# Patient Record
Sex: Female | Born: 2000 | Race: Black or African American | Hispanic: No | Marital: Single | Smoking: Never smoker
Health system: Southern US, Community
[De-identification: ages and names within clinical notes are randomized; demographics above are authoritative.]

---
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
## 2020-10-05 ENCOUNTER — Ambulatory Visit: Payer: Self-pay

## 2020-10-12 ENCOUNTER — Other Ambulatory Visit: Payer: Self-pay

## 2020-10-12 ENCOUNTER — Encounter: Payer: Self-pay | Admitting: Plastic Surgery

## 2020-10-12 ENCOUNTER — Ambulatory Visit (INDEPENDENT_AMBULATORY_CARE_PROVIDER_SITE_OTHER): Payer: Medicaid Other | Admitting: Plastic Surgery

## 2020-10-12 DIAGNOSIS — G8929 Other chronic pain: Secondary | ICD-10-CM | POA: Diagnosis not present

## 2020-10-12 DIAGNOSIS — M542 Cervicalgia: Secondary | ICD-10-CM

## 2020-10-12 DIAGNOSIS — M546 Pain in thoracic spine: Secondary | ICD-10-CM | POA: Diagnosis not present

## 2020-10-12 DIAGNOSIS — N62 Hypertrophy of breast: Secondary | ICD-10-CM | POA: Diagnosis not present

## 2020-10-12 DIAGNOSIS — M549 Dorsalgia, unspecified: Secondary | ICD-10-CM | POA: Insufficient documentation

## 2020-10-12 NOTE — Progress Notes (Signed)
Patient ID: Mariah Wilson, female    DOB: 10-15-2000, 20 y.o.   MRN: [id number]   Chief Complaint  Patient presents with   Advice Only    Mammary Hyperplasia: The patient is a 20 y.o. female with a history of mammary hyperplasia for several years.  She has extremely large breasts causing symptoms that include the following: Back pain in the upper and lower back, including neck pain. She pulls or pins her bra straps to provide better lift and relief of the pressure and pain. She notices relief by holding her breast up manually.  Her shoulder straps cause grooves and pain and pressure that requires padding for relief. Pain medication is sometimes required with motrin and tylenol.  Activities that are hindered by enlarged breasts include: exercise and running.  She has tried supportive clothing as well as fitted bras without improvement.  Her breasts are extremely large and fairly symmetric but the right 1 is slightly longer.  She has hyperpigmentation of the inframammary area on both sides.  The sternal to nipple distance on the right is 39 cm and the left is 37 cm.  The IMF distance is 17 cm.  She is 5 feet 4 inches tall and weighs 265 pounds.  The BMI = 45.5.  Preoperative bra size = H cup.  She would like to be a DD cup.  The estimated excess breast tissue to be removed at the time of surgery = 900 grams on the left and 900 grams on the right.  Mammogram history: None.  Family history of breast cancer: None.  Tobacco use: None.   She has not done physical therapy but she is willing.  The patient expresses the desire to pursue surgical intervention. She has a history of hypertrophic scars.  One on her nose and 1 on her left wrist.  She had a cyst removed from her left breast that did heal well.   Review of Systems  Constitutional:  Positive for activity change. Negative for appetite change.  Eyes: Negative.   Respiratory: Negative.  Negative for chest tightness and shortness of breath.    Cardiovascular: Negative.  Negative for leg swelling.  Gastrointestinal: Negative.   Endocrine: Negative.   Genitourinary: Negative.   Musculoskeletal:  Positive for back pain and neck pain.  Skin:  Positive for rash. Negative for color change and wound.  Neurological: Negative.   Hematological: Negative.    History reviewed. No pertinent past medical history.  History reviewed. No pertinent surgical history.    Current Outpatient Medications:    fexofenadine (ALLEGRA) 180 MG tablet, Take 180 mg by mouth daily., Disp: , Rfl:    naproxen (NAPROSYN) 500 MG tablet, Take 500 mg by mouth 2 (two) times daily with a meal., Disp: , Rfl:    Objective:   Vitals:   10/12/20 1006  BP: 117/77  Pulse: 61  SpO2: 97%    Physical Exam Vitals and nursing note reviewed.  Constitutional:      Appearance: Normal appearance.  HENT:     Head: Normocephalic and atraumatic.  Cardiovascular:     Rate and Rhythm: Normal rate.     Pulses: Normal pulses.  Pulmonary:     Effort: Pulmonary effort is normal. No respiratory distress.     Breath sounds: No stridor.  Abdominal:     General: Abdomen is flat. There is no distension.     Tenderness: There is no abdominal tenderness.  Skin:    General: Skin is warm.  Capillary Refill: Capillary refill takes less than 2 seconds.     Coloration: Skin is not jaundiced.     Findings: No bruising or lesion.  Neurological:     General: No focal deficit present.     Mental Status: She is alert and oriented to person, place, and time.  Psychiatric:        Mood and Affect: Mood normal.        Behavior: Behavior normal.        Thought Content: Thought content normal.    Assessment & Plan:  Neck pain  Chronic bilateral thoracic back pain  Symptomatic mammary hypertrophy  The procedure the patient selected and that was best for the patient was discussed. The risk were discussed and include but not limited to the following:  Breast asymmetry, fluid  accumulation, firmness of the breast, inability to breast feed, loss of nipple or areola, skin loss, change in skin and nipple sensation, fat necrosis of the breast tissue, bleeding, infection and healing delay.  There are risks of anesthesia and injury to nerves or blood vessels.  Allergic reaction to tape, suture and skin glue are possible.  There will be swelling.  Any of these can lead to the need for revisional surgery.  A breast reduction has potential to interfere with diagnostic procedures in the future.  This procedure is best done when the breast is fully developed.  Changes in the breast will continue to occur over time: pregnancy, weight gain or weigh loss.    Total time: 40 minutes. This includes time spent with the patient during the visit as well as time spent before and after the visit reviewing the chart, documenting the encounter and ordering pertinent studies. and literature emailed to the patient.   Physical therapy: Order placed Mammogram: Not indicated Healthy Weight and Wellness:  Referral made  The patient is a good candidate for bilateral breast reduction.  With her BMI as high as it is she will DC to decrease her weight by 10 to 15 pounds in order to get off the amount that will be required from her insurance.  We went over all this with the patient and she is excited for the possibility and willing to do the above plan as laid out.  She knows to give [place] a call back once she has achieved the goals and finished physical therapy.  Pictures were obtained of the patient and placed in the chart with the patient's or guardian's permission.   Alena Bills [name], DO

## 2020-10-25 ENCOUNTER — Ambulatory Visit: Payer: BLUE CROSS/BLUE SHIELD | Attending: Plastic Surgery

## 2020-10-25 ENCOUNTER — Other Ambulatory Visit: Payer: Self-pay

## 2020-10-25 DIAGNOSIS — M545 Low back pain, unspecified: Secondary | ICD-10-CM | POA: Insufficient documentation

## 2020-10-25 DIAGNOSIS — M546 Pain in thoracic spine: Secondary | ICD-10-CM | POA: Insufficient documentation

## 2020-10-25 DIAGNOSIS — G8929 Other chronic pain: Secondary | ICD-10-CM | POA: Diagnosis present

## 2020-10-25 NOTE — Therapy (Signed)
[facility] Outpatient Rehabilitation [facility] [address] [place], [place], [address] Phone: [phone]   Fax:  [phone]  Physical Therapy Evaluation  Patient Details  Name: Mariah Wilson MRN: [id number] Date of Birth: 2000-04-15 Referring Provider (PT): Peggye Form, DO   Encounter Date: 10/25/2020   PT End of Session - 10/25/20 0904     Visit Number 1    Number of Visits 6    Date for PT Re-Evaluation 12/20/20    Authorization Type MCD - [employer]    PT Start Time 0832    PT Stop Time 0902    PT Time Calculation (min) 30 min    Activity Tolerance Patient tolerated treatment well    Behavior During Therapy [facility] for tasks assessed/performed             No past medical history on file.  No past surgical history on file.  There were no vitals filed for this visit.    Subjective Assessment - 10/25/20 0834     Subjective Pt presents to PT with reports of lower and mid back pain for years due to large breast size. She notes that when she lies supine she feels increased pressure on her chest and back. She has to sleep in sidelying to avoid increasing back pain and get comfortable. Denies b/b changes, saddle anesthesia, or paresthesias in UE/LE. Pt would like to be more active, but feels limited by pain secondary larger breast size.    Pertinent History chronic hx of mid and low back pain secondary to large breast size    Limitations Walking;Standing    How long can you sit comfortably? 30-45 min    How long can you stand comfortably? 1-2 hrs    How long can you walk comfortably? indefinte - (but has pain after)    Patient Stated Goals would like to exercise a little more comfortably    Currently in Pain? 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  No    Is the patient reluctant to leave their home because of a fear of falling?  No      Home Environment   Living Environment Private residence    Type of Home House    Additional Comments no barriers to get in and out of house      Prior Function   Level of Independence  Independent;Independent with basic ADLs      Cognition   Overall Cognitive Status Within Functional Limits for tasks assessed    Attention Focused      Observation/Other Assessments   Focus on Therapeutic Outcomes (FOTO)  no FOTO - MCD and Mammary Hyp      Sensation   Light Touch Appears Intact      Posture/Postural Control   Posture Comments increased lordosis, increased kyphosis, rounded shoulders      Palpation   Palpation comment TTP to thoracic and lumbar paraspinals      Transfers   Comments 30 Sec STS: 9 reps                        Objective measurements completed on examination: See above findings.                PT Education - 10/25/20 0901     Education Details eval findings, oswestry review, HEP, POC    Person(s) Educated Patient    Methods Explanation;Demonstration;Handout    Comprehension Verbalized understanding;Returned demonstration              PT Short Term Goals - 10/25/20 0904       PT SHORT TERM GOAL #1   Title  Pt will be knowledgeable and compliant with initial HEP for improved carryover    Baseline initial HEP given    Time 3    Period Weeks    Status New    Target Date 11/15/20               PT Long Term Goals - 10/25/20 0905       PT LONG TERM GOAL #1   Title Pt will decreae ODI score to no greater than 35% as proxy for functional improvement    Baseline 48% disability    Time 6    Period Weeks    Status New    Target Date 12/06/20      PT LONG TERM GOAL #2   Title Pt will decrease reports of mid and low back pain to no greater than 4/10 at worst for improved comfort and function    Baseline 9/10 at worst    Time 6    Period Weeks    Status New    Target Date 12/06/20      PT LONG TERM GOAL #3   Title Pt will increase reps in 30 sec STS to no less than 12 for improve functional mobility and hip strength    Baseline 9 reps    Time 6    Period Weeks    Status New    Target Date 12/06/20                    Plan - 10/25/20 0911     Clinical Impression Statement Pt is a pleasant 20 y/o F who presents to PT with chronic reports of mid and low back pain secondary to large breast size. Physical findings are consistent with subjective compliants and MD impression, as pt demos postural deficits, functional mobility weaknesses, and palpable LBP. Her Oswestry score indicates moderate-to-severe disability in the performance of home ADLs and activities. She would benefit from skilled PT services working on improving strength and posture in order to decrease pain.    Personal Factors and Comorbidities Fitness    Examination-Activity Limitations Squat;Stairs;Stand;Lift;Carry    Examination-Participation Restrictions Yard Work;Occupation;Community Activity    Stability/Clinical Decision Making Stable/Uncomplicated  Clinical Decision Making Low    Rehab Potential Good    PT Frequency 1x / week    PT Duration 6 weeks    PT Treatment/Interventions ADLs/Self Care Home  Management;Electrical Stimulation;Moist Heat;Cryotherapy;Functional mobility training;Therapeutic activities;Therapeutic exercise;Neuromuscular re-education;Patient/family education;Manual techniques;Joint Manipulations;Spinal Manipulations    PT Next Visit Plan assess HEP response, progress as able    PT Home Exercise Plan Access Code: [email or web address]    Consulted and Agree with Plan of Care Patient             Patient will benefit from skilled therapeutic intervention in order to improve the following deficits and impairments:  Decreased activity tolerance, Pain, Postural dysfunction, Decreased strength  Visit Diagnosis: Chronic midline low back pain without sciatica - Plan: PT plan of care cert/re-cert  Pain in thoracic spine - Plan: PT plan of care cert/re-cert     Problem List Patient Active Problem List   Diagnosis Date Noted   Neck pain 10/12/2020   Back pain 10/12/2020   Symptomatic mammary hypertrophy 10/12/2020    Eloy End, PT 10/25/2020, 9:35 AM  [facility] [address] [place], [place], [address] Phone: [phone]   Fax:  [phone]  Name: Mariah Wilson MRN: [id number] Date of Birth: Oct 15, 2000  Check all possible CPT codes: 97110- Therapeutic Exercise, [phone]- Neuro Re-education, [phone] - Gait Training, [phone] - Manual Therapy, 97530 - Therapeutic Activities, and 97535 - Self Care

## 2020-11-03 ENCOUNTER — Ambulatory Visit: Payer: BLUE CROSS/BLUE SHIELD

## 2020-11-10 ENCOUNTER — Encounter: Payer: Self-pay | Admitting: Physical Therapy

## 2020-11-10 ENCOUNTER — Other Ambulatory Visit: Payer: Self-pay

## 2020-11-10 ENCOUNTER — Ambulatory Visit: Payer: BLUE CROSS/BLUE SHIELD | Attending: Plastic Surgery | Admitting: Physical Therapy

## 2020-11-10 DIAGNOSIS — M546 Pain in thoracic spine: Secondary | ICD-10-CM | POA: Diagnosis present

## 2020-11-10 DIAGNOSIS — M545 Low back pain, unspecified: Secondary | ICD-10-CM | POA: Diagnosis not present

## 2020-11-10 DIAGNOSIS — G8929 Other chronic pain: Secondary | ICD-10-CM | POA: Insufficient documentation

## 2020-11-10 NOTE — Therapy (Signed)
[facility] Outpatient Rehabilitation [facility] [address] [place], [place], [address] Phone: [phone]   Fax:  [phone]  Physical Therapy Treatment  Patient Details  Name: Mariah Wilson MRN: [id number] Date of Birth: 21-Sep-2000 Referring Provider (PT): Peggye Form, DO   Encounter Date: 11/10/2020   PT End of Session - 11/10/20 1022     Visit Number 2    Number of Visits 6    Date for PT Re-Evaluation 12/20/20    Authorization Type MCD - [employer]    Authorization - Number of Visits 12    PT Start Time 1017    PT Stop Time 1055    PT Time Calculation (min) 38 min             History reviewed. No pertinent past medical history.  History reviewed. No pertinent surgical history.  There were no vitals filed for this visit.   Subjective Assessment - 11/10/20 1020     Subjective Pt reports 7/10 low back pain on arrival, no upper back and neck pain.    Currently in Pain? Yes                               [facility] Adult PT Treatment/Exercise - 11/10/20 0001       Neck Exercises: Machines for Strengthening   UBE (Upper Arm Bike) L1 x 3 minutes reverse with cues for posture      Lumbar Exercises: Stretches   Single Knee to Chest Stretch 3 reps;30 seconds    Quadruped Mid Back Stretch Limitations childs pose    Other Lumbar Stretch Exercise door way pec stretch x 3    Other Lumbar Stretch Exercise open books      Lumbar Exercises: Standing   Row 20 reps    Theraband Level (Row) Level 4 (Blue)      Lumbar Exercises: Supine   Bridge 10 reps;5 seconds    Other Supine Lumbar Exercises Horizontal abduction x 20  Green band , ER green band      Lumbar Exercises: Quadruped   Madcat/Old Horse 10 reps    Madcat/Old Horse Limitations cues for technique    Single Arm Raise 10 reps    Single Arm Raises Limitations cues for abdominal draw in and breathing    Straight Leg Raise 10 reps   right wrist discomfort   Straight  Leg Raises Limitations cues for abdominal draw in and breathing    Other Quadruped Lumbar Exercises Abdominal draw ins with breathing    Other Quadruped Lumbar Exercises quadruped rocking into childs pose                       PT Short Term Goals - 11/10/20 1030       PT SHORT TERM GOAL #1   Title Pt will be knowledgeable and compliant with initial HEP for improved carryover    Baseline initial HEP given; 11/10/20 -min compliance with HEP    Time 3    Period Weeks    Status On-going    Target Date 11/15/20               PT Long Term Goals - 10/25/20 0905       PT LONG TERM GOAL #1   Title Pt will decreae ODI score to no greater than 35% as proxy for functional improvement    Baseline 48% disability    Time 6  Period Weeks    Status New    Target Date 12/06/20      PT LONG TERM GOAL #2   Title Pt will decrease reports of mid and low back pain to no greater than 4/10 at worst for improved comfort and function    Baseline 9/10 at worst    Time 6    Period Weeks    Status New    Target Date 12/06/20      PT LONG TERM GOAL #3   Title Pt will increase reps in 30 sec STS to no less than 12 for improve functional mobility and hip strength    Baseline 9 reps    Time 6    Period Weeks    Status New    Target Date 12/06/20                   Plan - 11/10/20 1022     Clinical Impression Statement Pt reports min compliance with HEP and 7/10 low back pain on arrival. Reviewed HEP and progressed with postural and core strength and mobility exercises. Pt tolerated the session well without c/o increased pain. She was given an updated HEP. She reported decreased back pain at end of session.    PT Treatment/Interventions ADLs/Self Care Home Management;Electrical Stimulation;Moist Heat;Cryotherapy;Functional mobility training;Therapeutic activities;Therapeutic exercise;Neuromuscular re-education;Patient/family education;Manual techniques;Joint  Manipulations;Spinal Manipulations    PT Next Visit Plan assess HEP response, progress as able, bird dogs, thoracic extension    PT Home Exercise Plan Access Code: [email or web address]    Consulted and Agree with Plan of Care Patient             Patient will benefit from skilled therapeutic intervention in order to improve the following deficits and impairments:  Decreased activity tolerance, Pain, Postural dysfunction, Decreased strength  Visit Diagnosis: Chronic midline low back pain without sciatica  Pain in thoracic spine     Problem List Patient Active Problem List   Diagnosis Date Noted   Neck pain 10/12/2020   Back pain 10/12/2020   Symptomatic mammary hypertrophy 10/12/2020    Sherrie Mustache, PTA 11/10/2020, 12:15 PM  [facility] [address] [place], [place], [address] Phone: [phone]   Fax:  [phone]  Name: Mariah Wilson MRN: [id number] Date of Birth: 04/30/2000

## 2020-11-17 ENCOUNTER — Encounter: Payer: Self-pay | Admitting: Physical Therapy

## 2020-11-17 ENCOUNTER — Other Ambulatory Visit: Payer: Self-pay

## 2020-11-17 ENCOUNTER — Ambulatory Visit: Payer: BLUE CROSS/BLUE SHIELD | Admitting: Physical Therapy

## 2020-11-17 DIAGNOSIS — M545 Low back pain, unspecified: Secondary | ICD-10-CM

## 2020-11-17 DIAGNOSIS — M546 Pain in thoracic spine: Secondary | ICD-10-CM

## 2020-11-17 DIAGNOSIS — G8929 Other chronic pain: Secondary | ICD-10-CM

## 2020-11-17 NOTE — Therapy (Signed)
[facility] Outpatient Rehabilitation [facility] [address] [place], [place], [address] Phone: [phone]   Fax:  [phone]  Physical Therapy Treatment  Patient Details  Name: Mariah Wilson MRN: [id number] Date of Birth: 04/12/2000 Referring Provider (PT): Peggye Form, DO   Encounter Date: 11/17/2020   PT End of Session - 11/17/20 1226     Visit Number 3    Number of Visits 6    Date for PT Re-Evaluation 12/20/20    Authorization Type MCD - [employer]    Authorization - Number of Visits 12    PT Start Time 0930    PT Stop Time 1010    PT Time Calculation (min) 40 min             History reviewed. No pertinent past medical history.  History reviewed. No pertinent surgical history.  There were no vitals filed for this visit.   Subjective Assessment - 11/17/20 0934     Subjective I have lower back pain with standing for work. Today, I have pain in my mid back. 6/10.    Pertinent History chronic hx of mid and low back pain secondary to large breast size    Currently in Pain? Yes    Pain Score 6     Pain Location Back    Pain Orientation Mid    Pain Descriptors / Indicators Sore;Aching    Pain Type Chronic pain    Aggravating Factors  standing    Pain Relieving Factors lying on side                   [facility] Adult PT Treatment/Exercise - 11/17/20 0001       Transfers   Five time sit to stand comments  14.3 sec      Neck Exercises: Machines for Strengthening   Nustep L5 UE/LE x 5 minutes    Cybex Row 25# low and mid x 15 each    Lat Pull 20# 15 x 2      Lumbar Exercises: Stretches   Single Knee to Chest Stretch 3 reps;30 seconds    Other Lumbar Stretch Exercise door way pec stretch x 3    Other Lumbar Stretch Exercise open books      Lumbar Exercises: Supine   Bridge 10 reps;5 seconds    Other Supine Lumbar Exercises Horizontal abduction x 20  Green band , ER green band    Other Supine Lumbar Exercises 90/90  lifting LE one at a time. x 10      Lumbar Exercises: Quadruped   Madcat/Old Horse 10 reps    Madcat/Old Horse Limitations cues for technique    Straight Leg Raise 10 reps   right wrist discomfort   Straight Leg Raises Limitations cues for abdominal draw in and breathing    Opposite Arm/Leg Raise 10 reps    Opposite Arm/Leg Raise Limitations 5 sec    Other Quadruped Lumbar Exercises quadruped rocking into childs pose                       PT Short Term Goals - 11/10/20 1030       PT SHORT TERM GOAL #1   Title Pt will be knowledgeable and compliant with initial HEP for improved carryover    Baseline initial HEP given; 11/10/20 -min compliance with HEP    Time 3    Period Weeks    Status On-going    Target Date 11/15/20  PT Long Term Goals - 10/25/20 0905       PT LONG TERM GOAL #1   Title Pt will decreae ODI score to no greater than 35% as proxy for functional improvement    Baseline 48% disability    Time 6    Period Weeks    Status New    Target Date 12/06/20      PT LONG TERM GOAL #2   Title Pt will decrease reports of mid and low back pain to no greater than 4/10 at worst for improved comfort and function    Baseline 9/10 at worst    Time 6    Period Weeks    Status New    Target Date 12/06/20      PT LONG TERM GOAL #3   Title Pt will increase reps in 30 sec STS to no less than 12 for improve functional mobility and hip strength    Baseline 9 reps    Time 6    Period Weeks    Status New    Target Date 12/06/20                   Plan - 11/17/20 0936     Clinical Impression Statement Pt reports increased compliance with HEP , performing 3 times over the last week. She reports the exercises helpt to decrease the tension in her back. Continued with postural strength and nobility per PT POC. Pt tolerated all therex well without increased pain.    PT Treatment/Interventions ADLs/Self Care Home Management;Electrical  Stimulation;Moist Heat;Cryotherapy;Functional mobility training;Therapeutic activities;Therapeutic exercise;Neuromuscular re-education;Patient/family education;Manual techniques;Joint Manipulations;Spinal Manipulations    PT Next Visit Plan assess HEP response, progress as able, bird dogs, thoracic extension    PT Home Exercise Plan Access Code: [email or web address]             Patient will benefit from skilled therapeutic intervention in order to improve the following deficits and impairments:  Decreased activity tolerance, Pain, Postural dysfunction, Decreased strength  Visit Diagnosis: Chronic midline low back pain without sciatica  Pain in thoracic spine     Problem List Patient Active Problem List   Diagnosis Date Noted   Neck pain 10/12/2020   Back pain 10/12/2020   Symptomatic mammary hypertrophy 10/12/2020    Sherrie Mustache, PTA 11/17/2020, 12:28 PM  [facility] [address] [place], [place], [address] Phone: [phone]   Fax:  [phone]  Name: Mariah Wilson MRN: [id number] Date of Birth: October 10, 2000

## 2020-11-24 ENCOUNTER — Encounter: Payer: Self-pay | Admitting: Physical Therapy

## 2020-11-24 ENCOUNTER — Ambulatory Visit: Payer: BLUE CROSS/BLUE SHIELD | Admitting: Physical Therapy

## 2020-11-24 ENCOUNTER — Other Ambulatory Visit: Payer: Self-pay

## 2020-11-24 DIAGNOSIS — M546 Pain in thoracic spine: Secondary | ICD-10-CM

## 2020-11-24 DIAGNOSIS — M545 Low back pain, unspecified: Secondary | ICD-10-CM

## 2020-11-24 DIAGNOSIS — G8929 Other chronic pain: Secondary | ICD-10-CM

## 2020-11-24 NOTE — Therapy (Signed)
[facility] Outpatient Rehabilitation [facility] [address] [place], [place], [address] Phone: [phone]   Fax:  [phone]  Physical Therapy Treatment  Patient Details  Name: Mariah Wilson MRN: [id number] Date of Birth: 22-Jan-2001 Referring Provider (PT): Peggye Form, DO   Encounter Date: 11/24/2020   PT End of Session - 11/24/20 0949     Visit Number 4    Number of Visits 6    Date for PT Re-Evaluation 12/20/20    Authorization Type MCD - [employer]    Authorization - Number of Visits 12    PT Start Time 0945   15 minutes late   PT Stop Time 1013    PT Time Calculation (min) 28 min             History reviewed. No pertinent past medical history.  History reviewed. No pertinent surgical history.  There were no vitals filed for this visit.   Subjective Assessment - 11/24/20 0947     Subjective My back has felt better. I have been more accountable with my exercises.    Currently in Pain? Yes    Pain Score 5     Pain Location Back    Pain Orientation Mid    Pain Descriptors / Indicators Discomfort    Pain Type Chronic pain    Aggravating Factors  standing    Pain Relieving Factors lying on side                        [facility] Adult PT Treatment/Exercise - 11/24/20 0001       Neck Exercises: Machines for Strengthening   UBE (Upper Arm Bike) L2 3 min each way    Cybex Row 35# low and mid row x 15 each    Lat Pull 25# 15 x 2      Lumbar Exercises: Stretches   Other Lumbar Stretch Exercise door way pec stretch x 3      Lumbar Exercises: Standing   Other Standing Lumbar Exercises Green band star pattern x 15      Lumbar Exercises: Supine   Other Supine Lumbar Exercises 90/90 lifting LE one at a time. x 10      Lumbar Exercises: Quadruped   Opposite Arm/Leg Raise 10 reps    Opposite Arm/Leg Raise Limitations 5 sec                       PT Short Term Goals - 11/10/20 1030       PT SHORT TERM  GOAL #1   Title Pt will be knowledgeable and compliant with initial HEP for improved carryover    Baseline initial HEP given; 11/10/20 -min compliance with HEP    Time 3    Period Weeks    Status On-going    Target Date 11/15/20               PT Long Term Goals - 10/25/20 0905       PT LONG TERM GOAL #1   Title Pt will decreae ODI score to no greater than 35% as proxy for functional improvement    Baseline 48% disability    Time 6    Period Weeks    Status New    Target Date 12/06/20      PT LONG TERM GOAL #2   Title Pt will decrease reports of mid and low back pain to no greater than 4/10 at worst for improved  comfort and function    Baseline 9/10 at worst    Time 6    Period Weeks    Status New    Target Date 12/06/20      PT LONG TERM GOAL #3   Title Pt will increase reps in 30 sec STS to no less than 12 for improve functional mobility and hip strength    Baseline 9 reps    Time 6    Period Weeks    Status New    Target Date 12/06/20                   Plan - 11/24/20 1013     Clinical Impression Statement Pt arrived late today. Shorter session focused on posterior chain and core strengthening. Pt verbalized overall improvement in pain.    PT Treatment/Interventions ADLs/Self Care Home Management;Electrical Stimulation;Moist Heat;Cryotherapy;Functional mobility training;Therapeutic activities;Therapeutic exercise;Neuromuscular re-education;Patient/family education;Manual techniques;Joint Manipulations;Spinal Manipulations    PT Next Visit Plan assess HEP response, progress as able, bird dogs, thoracic extension    PT Home Exercise Plan Access Code: [email or web address]             Patient will benefit from skilled therapeutic intervention in order to improve the following deficits and impairments:  Decreased activity tolerance, Pain, Postural dysfunction, Decreased strength  Visit Diagnosis: Chronic midline low back pain without sciatica  Pain in thoracic  spine     Problem List Patient Active Problem List   Diagnosis Date Noted   Neck pain 10/12/2020   Back pain 10/12/2020   Symptomatic mammary hypertrophy 10/12/2020    Sherrie Mustache, PTA 11/24/2020, 10:14 AM  [facility] [address] [place], [place], [address] Phone: [phone]   Fax:  [phone]  Name: Mariah Wilson MRN: [id number] Date of Birth: 2000-04-30

## 2020-12-01 ENCOUNTER — Ambulatory Visit: Payer: BLUE CROSS/BLUE SHIELD

## 2020-12-01 ENCOUNTER — Other Ambulatory Visit: Payer: Self-pay

## 2020-12-01 DIAGNOSIS — M545 Low back pain, unspecified: Secondary | ICD-10-CM | POA: Diagnosis not present

## 2020-12-01 DIAGNOSIS — M546 Pain in thoracic spine: Secondary | ICD-10-CM

## 2020-12-01 DIAGNOSIS — G8929 Other chronic pain: Secondary | ICD-10-CM

## 2020-12-01 NOTE — Therapy (Signed)
[facility] [address] [place], [place], [address] Phone: [phone]   Fax:  [phone]  Physical Therapy Treatment  Patient Details  Name: Mariah Wilson MRN: [id number] Date of Birth: 08/20/2000 Referring Provider (PT): Peggye Form, DO   Encounter Date: 12/01/2020   PT End of Session - 12/01/20 1002     Visit Number 5    Number of Visits 6    Date for PT Re-Evaluation 12/20/20    Authorization Type MCD - [employer]    Authorization - Number of Visits 12    PT Start Time 1002    PT Stop Time 1040    PT Time Calculation (min) 38 min             No past medical history on file.  No past surgical history on file.  There were no vitals filed for this visit.   Subjective Assessment - 12/01/20 1003     Subjective Pt presents to PT with reports of continued mid back pain and discomfort. Has been compliant with her HEP with no adverse effect. Ready to begin PT at this time.    Currently in Pain? Yes    Pain Score 4     Pain Location Back    Pain Orientation Mid           [facility] Adult PT Treatment/Exercise:   Therapeutic Exercise:  UBE lvl 2.0 x 4 min while taking subjective Total gym row 2x10 35lbs Lat pulldown 2x10 35lbs Row 3x12 23lbs Shoulder ext 2x10 23lbs Supine horizontal abd 3x10 green tband S/L open book 2x10 ea Bird dog 2x10 Seated thoracic ext over soft foam x 15 Serratus wall slide soft foam yellow tband 2x10                               PT Short Term Goals - 11/10/20 1030       PT SHORT TERM GOAL #1   Title Pt will be knowledgeable and compliant with initial HEP for improved carryover    Baseline initial HEP given; 11/10/20 -min compliance with HEP    Time 3    Period Weeks    Status On-going    Target Date 11/15/20               PT Long Term Goals - 10/25/20 0905       PT LONG TERM GOAL #1   Title Pt will decreae ODI score to no  greater than 35% as proxy for functional improvement    Baseline 48% disability    Time 6    Period Weeks    Status New    Target Date 12/06/20      PT LONG TERM GOAL #2   Title Pt will decrease reports of mid and low back pain to no greater than 4/10 at worst for improved comfort and function    Baseline 9/10 at worst    Time 6    Period Weeks    Status New    Target Date 12/06/20      PT LONG TERM GOAL #3   Title Pt will increase reps in 30 sec STS to no less than 12 for improve functional mobility and hip strength    Baseline 9 reps    Time 6    Period Weeks    Status New    Target Date 12/06/20  Plan - 12/01/20 1006     Clinical Impression Statement Pt was able to complete all prescribed exercises with no adverse effect. Today's session focused on continued periscapular and core muscle strengthening to reduce pain. PT will assess goals at next session for possible d/c per POC.    PT Treatment/Interventions ADLs/Self Care Home Management;Electrical Stimulation;Moist Heat;Cryotherapy;Functional mobility training;Therapeutic activities;Therapeutic exercise;Neuromuscular re-education;Patient/family education;Manual techniques;Joint Manipulations;Spinal Manipulations    PT Next Visit Plan assess goals and prepare discharge HEP    PT Home Exercise Plan Access Code: [email or web address]             Patient will benefit from skilled therapeutic intervention in order to improve the following deficits and impairments:  Decreased activity tolerance, Pain, Postural dysfunction, Decreased strength  Visit Diagnosis: Chronic midline low back pain without sciatica  Pain in thoracic spine     Problem List Patient Active Problem List   Diagnosis Date Noted   Neck pain 10/12/2020   Back pain 10/12/2020   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 [address] [place], [place], [address] Phone: [phone]   Fax:  [phone]  Name: Mariah Wilson MRN: [id number] Date of Birth: May 08, 2000

## 2020-12-08 ENCOUNTER — Other Ambulatory Visit: Payer: Self-pay

## 2020-12-08 ENCOUNTER — Ambulatory Visit: Payer: BLUE CROSS/BLUE SHIELD | Attending: Plastic Surgery | Admitting: Physical Therapy

## 2020-12-08 DIAGNOSIS — M545 Low back pain, unspecified: Secondary | ICD-10-CM | POA: Diagnosis present

## 2020-12-08 DIAGNOSIS — M546 Pain in thoracic spine: Secondary | ICD-10-CM | POA: Insufficient documentation

## 2020-12-08 DIAGNOSIS — G8929 Other chronic pain: Secondary | ICD-10-CM | POA: Insufficient documentation

## 2020-12-08 NOTE — Therapy (Addendum)
[facility] [place], [place], [address] Phone: [phone]   Fax:  [phone]  Physical Therapy Treatment/Discharge  Patient Details  Name: Mariah Wilson MRN: [id number] Date of Birth: 2000-11-02 Referring Provider (PT): Wallace Going, DO   Encounter Date: 12/08/2020   PT End of Session - 12/08/20 1023     Visit Number 6    Number of Visits 6    Date for PT Re-Evaluation 12/20/20    Authorization Type MCD - [employer]    Authorization - Number of Visits 12    PT Start Time 9458    PT Stop Time 1045    PT Time Calculation (min) 30 min             No past medical history on file.  No past surgical history on file.  There were no vitals filed for this visit.   Subjective Assessment - 12/08/20 1021     Subjective 4/10 now. The worst can be 7.5 on a bad day of long work hours or sitting in a chair all class.    Currently in Pain? Yes    Pain Score 4     Pain Location Back    Pain Orientation Mid;Lower;Upper    Pain Descriptors / Indicators Aching    Pain Type Chronic pain    Aggravating Factors  standing, prolonged sitting, working    Pain Relieving Factors lying on side , stretches                [facility] PT Assessment - 12/08/20 0001       Observation/Other Assessments   Other Surveys  Oswestry Disability Index    Oswestry Disability Index  11- Mild disability      Transfers   Comments 30 Sec STS: 14 reps                  [facility] Adult PT Treatment/Exercise - 12/08/20 0001       Neck Exercises: Machines for Strengthening   UBE (Upper Arm Bike) L2 3 min each way      Lumbar Exercises: Stretches   Quadruped Mid Back Stretch Limitations childs pose    Other Lumbar Stretch Exercise door way pec stretch x 3    Other Lumbar Stretch Exercise open books      Lumbar Exercises: Standing   Row 20 reps    Theraband Level (Row) Level 4 (Blue)    Shoulder Extension 20 reps     Theraband Level (Shoulder Extension) Level 4 (Blue)    Other Standing Lumbar Exercises Green band star pattern x 15      Lumbar Exercises: Supine   Other Supine Lumbar Exercises 90/90 lifting LE one at a time. x 10      Lumbar Exercises: Quadruped   Opposite Arm/Leg Raise 10 reps    Opposite Arm/Leg Raise Limitations 5 sec                       PT Short Term Goals - 12/08/20 1024       PT SHORT TERM GOAL #1   Title Pt will be knowledgeable and compliant with initial HEP for improved carryover    Baseline initial HEP given; 11/10/20 -min compliance with HEP; 12/08/20    Time 3    Period Weeks    Status Achieved    Target Date 11/15/20               PT  Long Term Goals - 12/08/20 1025       PT LONG TERM GOAL #1   Title Pt will decreae ODI score to no greater than 35% as proxy for functional improvement    Baseline 48% disability; 12/08/20: 11 points (mild disability)    Time 6    Period Weeks    Status Achieved      PT LONG TERM GOAL #2   Title Pt will decrease reports of mid and low back pain to no greater than 4/10 at worst for improved comfort and function    Baseline 9/10 at worst;12/08/20  7.5 at worst    Time 6    Period Weeks    Status Not Met      PT LONG TERM GOAL #3   Title Pt will increase reps in 30 sec STS to no less than 12 for improve functional mobility and hip strength    Baseline 9 reps: 12/08/20: 14 reps    Time 6    Period Weeks    Status Achieved                   Plan - 12/08/20 1043     Clinical Impression Statement 30 sec STS improved to 14 reps and ODI survey improved to mild disability LTG# 1,3 met. Her pain level has decreased from her highest reaching 9.5/10 to now reaching 7.5/10 at most. She is independent with her HEP and agreeable to discharge today. She has plans to make a F/U appointment to discuss potential surgical intervention.    PT Treatment/Interventions ADLs/Self Care Home Management;Electrical  Stimulation;Moist Heat;Cryotherapy;Functional mobility training;Therapeutic activities;Therapeutic exercise;Neuromuscular re-education;Patient/family education;Manual techniques;Joint Manipulations;Spinal Manipulations    PT Next Visit Plan discharge to HEP today.    PT Home Exercise Plan Access Code: [email or web address]             Patient will benefit from skilled therapeutic intervention in order to improve the following deficits and impairments:  Decreased activity tolerance, Pain, Postural dysfunction, Decreased strength  Visit Diagnosis: Chronic midline low back pain without sciatica  Pain in thoracic spine     Problem List Patient Active Problem List   Diagnosis Date Noted   Neck pain 10/12/2020   Back pain 10/12/2020   Symptomatic mammary hypertrophy 10/12/2020    Dorene Ar, PTA 12/08/2020, 10:51 AM  [facility] [address] [place], [place], [address] Phone: [phone]   Fax:  [phone]  Name: Mariah Wilson MRN: [id number] Date of Birth: 11/20/2000  PHYSICAL THERAPY DISCHARGE SUMMARY  Visits from Start of Care: 6  Current functional level related to goals / functional outcomes: See goals and objective   Remaining deficits: Continued mid and low back pain   Education / Equipment: HEP   Patient agrees to discharge. Patient goals were  mostly met . Patient is being discharged due to maximized rehab potential.

## 2021-03-25 NOTE — Progress Notes (Signed)
° °  Referring Provider Dim[place], [place] [address]   CC:  Chief Complaint  Patient presents with   Follow-up      Mariah Wilson is an 21 y.o. female.  HPI: Patient is a 21 y.o. 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   Patient was seen for initial consult by Dr. Marla Roe on 10/12/2020.  At that time, STN noted to be 39 cm on the right, 37 cm in the left.  Preoperative bra size equals H cup.  Estimated excess breast tissue removed at time of surgery equals 900 g each side.  BMI was 45.5.  She reported history of hypertrophic scarring.  Patient reports that PT did help with some of her discomfort, but that she still is quite symptomatic from her large breasts and still would very much like to proceed with surgical intervention via breast reduction surgery.  She has lost 18 pounds since initial consult.  However, gym activities are still hindered by her large breasts.  She continues to experience upper back and neck discomfort, shoulder grooving, and intermittent rashes/irritation underneath her breasts.  She is currently working and Proofreader.  She would like to work in [place].  Review of Systems General: Back and neck discomfort, shoulder grooving. Denies current rash.  Physical Exam Vitals with BMI 03/30/2021 10/12/2020 03/08/2017  Height 5\' 5"  5\' 4"  -  Weight 247 lbs 265 lbs 3 oz -  BMI 123XX123 [id number] -  Systolic - 123XX123 A999333  Diastolic - 77 74  Pulse - 61 76    General:  No acute distress,  Alert and oriented, Non-Toxic, Normal speech and affect Psych: Normal behavior and mood MSK: Ambulatory.   Assessment/Plan  Patient is interested in pursuing surgical intervention for bilateral breast reduction. 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  Discussed with patient we would submit to insurance for authorization, discussed  approval could take up to 6 weeks.   Krista Blue 03/30/2021, 10:05 AM

## 2021-03-30 ENCOUNTER — Encounter: Payer: Self-pay | Admitting: Physician Assistant

## 2021-03-30 ENCOUNTER — Other Ambulatory Visit: Payer: Self-pay

## 2021-03-30 ENCOUNTER — Ambulatory Visit (INDEPENDENT_AMBULATORY_CARE_PROVIDER_SITE_OTHER): Payer: Medicaid Other | Admitting: Physician Assistant

## 2021-03-30 VITALS — Ht 65.0 in | Wt 247.0 lb

## 2021-03-30 DIAGNOSIS — M546 Pain in thoracic spine: Secondary | ICD-10-CM | POA: Diagnosis not present

## 2021-03-30 DIAGNOSIS — G8929 Other chronic pain: Secondary | ICD-10-CM

## 2021-04-19 ENCOUNTER — Telehealth: Payer: Self-pay

## 2021-04-19 NOTE — Telephone Encounter (Signed)
Called patient, advised due to her insurance company they denied for BL breast reduction. Due to surgery would not be for reconstruction related to breast cancer, asymmetry for mastectomy or lumpectomy as well as congenital absence of breast. However, she would like a cosmetic quote. ?

## 2021-11-27 ENCOUNTER — Telehealth: Payer: Medicaid Other | Admitting: Nurse Practitioner

## 2021-11-27 DIAGNOSIS — K047 Periapical abscess without sinus: Secondary | ICD-10-CM

## 2021-11-27 MED ORDER — AMOXICILLIN 500 MG PO CAPS: 500.0000 mg | ORAL_CAPSULE | Freq: Three times a day (TID) | ORAL | 0 refills | Status: AC

## 2021-11-27 NOTE — Progress Notes (Signed)

## 2021-11-27 NOTE — Progress Notes (Signed)
I have spent 5 minutes in review of e-visit questionnaire, review and updating patient chart, medical decision making and response to patient.  ° °Afton Mikelson W Chenise Mulvihill, NP ° °  °

## 2021-11-28 ENCOUNTER — Encounter (HOSPITAL_BASED_OUTPATIENT_CLINIC_OR_DEPARTMENT_OTHER): Payer: Self-pay | Admitting: Urology

## 2021-11-28 ENCOUNTER — Emergency Department (HOSPITAL_BASED_OUTPATIENT_CLINIC_OR_DEPARTMENT_OTHER)
Admission: EM | Admit: 2021-11-28 | Discharge: 2021-11-28 | Disposition: A | Discharge status: Home / Self Care | Payer: Medicaid Other | Attending: Emergency Medicine | Admitting: Emergency Medicine

## 2021-11-28 ENCOUNTER — Ambulatory Visit: Payer: Self-pay

## 2021-11-28 DIAGNOSIS — K0530 Chronic periodontitis, unspecified: Secondary | ICD-10-CM | POA: Insufficient documentation

## 2021-11-28 DIAGNOSIS — J029 Acute pharyngitis, unspecified: Secondary | ICD-10-CM | POA: Diagnosis present

## 2021-11-28 DIAGNOSIS — Z20822 Contact with and (suspected) exposure to covid-19: Secondary | ICD-10-CM | POA: Insufficient documentation

## 2021-11-28 LAB — RESP PANEL BY RT-PCR (FLU A&B, COVID) ARPGX2
Influenza A by PCR: NEGATIVE
Influenza B by PCR: NEGATIVE
SARS Coronavirus 2 by RT PCR: NEGATIVE

## 2021-11-28 LAB — GROUP A STREP BY PCR: Group A Strep by PCR: NOT DETECTED

## 2021-11-28 NOTE — ED Provider Notes (Signed)
[facility]  Provider Note  CSN: [id number] Arrival date & time: 11/28/21 0153  History Chief Complaint  Patient presents with   Sore Throat    Mariah Wilson is a 21 y.o. female with history of impacted wisdom teeth has been told she needed to have them cut out, reports increasing pain and swelling in her L upper molars the last 2 days. She had an evisit yesterday and was prescribed Amoxil which she has take two doses of. She presents today for evaluation because of the facial swelling and to make sure she isn't having an allergic reaction.    Home Medications Prior to Admission medications   Medication Sig Start Date End Date Taking? Authorizing Provider  amoxicillin (AMOXIL) 500 MG capsule Take 1 capsule (500 mg total) by mouth 3 (three) times daily for 10 days. 11/27/21 12/07/21  Gildardo Pounds, NP  fexofenadine (ALLEGRA) 180 MG tablet Take 180 mg by mouth daily.    [provider]  naproxen (NAPROSYN) 500 MG tablet Take 500 mg by mouth 2 (two) times daily with a meal.    [provider]     Allergies    Patient has no known allergies.   Review of Systems   Review of Systems Please see HPI for pertinent positives and negatives  Physical Exam BP (!) 150/93 (BP Location: Right Arm)   Pulse 97   Temp 99.8 F (37.7 C) (Oral)   Resp 18   Ht 5\' 5"  (1.651 m)   Wt 117.9 kg   SpO2 99%   BMI 43.27 kg/m   Physical Exam Vitals and nursing note reviewed.  Constitutional:      Appearance: Normal appearance.  HENT:     Head: Normocephalic and atraumatic.     Nose: Nose normal.     Mouth/Throat:     Mouth: Mucous membranes are moist.     Pharynx: No pharyngeal swelling or posterior oropharyngeal erythema.     Comments: Partially erupted L upper 3rd molar with moderate pericoronitis.  Eyes:     Extraocular Movements: Extraocular movements intact.     Conjunctiva/sclera: Conjunctivae normal.  Cardiovascular:     Rate  and Rhythm: Normal rate.  Pulmonary:     Effort: Pulmonary effort is normal.     Breath sounds: Normal breath sounds.  Abdominal:     General: Abdomen is flat.     Palpations: Abdomen is soft.     Tenderness: There is no abdominal tenderness.  Musculoskeletal:        General: No swelling. Normal range of motion.     Cervical back: Neck supple.  Lymphadenopathy:     Cervical: No cervical adenopathy.  Skin:    General: Skin is warm and dry.  Neurological:     General: No focal deficit present.     Mental Status: She is alert.  Psychiatric:        Mood and Affect: Mood normal.     ED Results / Procedures / Treatments   EKG None  Procedures Procedures  Medications Ordered in the ED Medications - No data to display  Initial Impression and Plan  Patient with pericoronitis, already on Amoxil, no signs of allergic reaction or drainable abscess. Recommend she continue Abx, follow up with dentist/oral surgery for definitive management. In triage she had swabs done negative for covid, flu, and strep.   ED Course       MDM Rules/Calculators/A&P Medical Decision Making Problems Addressed: Pericoronitis: acute illness  or injury  Amount and/or Complexity of Data Reviewed Labs: ordered. Decision-making details documented in ED Course.  Risk Prescription drug management.    Final Clinical Impression(s) / ED Diagnoses Final diagnoses:  Pericoronitis    Rx / DC Orders ED Discharge Orders     None        Pollyann Savoy, MD 11/28/21 [phone]

## 2021-11-28 NOTE — ED Triage Notes (Signed)
Pt states sore throat and left side lymph node swelling x 24 hrs  Denies fever  Denies N/V

## 2021-11-28 NOTE — ED Notes (Signed)
ED Provider at bedside. 

## 2022-06-09 ENCOUNTER — Telehealth: Payer: Medicaid Other | Admitting: Nurse Practitioner

## 2022-06-09 DIAGNOSIS — K047 Periapical abscess without sinus: Secondary | ICD-10-CM

## 2022-06-10 MED ORDER — AMOXICILLIN-POT CLAVULANATE 875-125 MG PO TABS: 1.0000 | ORAL_TABLET | Freq: Two times a day (BID) | ORAL | 0 refills | Status: DC

## 2022-06-10 NOTE — Progress Notes (Signed)

## 2022-09-11 ENCOUNTER — Telehealth: Payer: Medicaid Other | Admitting: Physician Assistant

## 2022-09-11 DIAGNOSIS — K047 Periapical abscess without sinus: Secondary | ICD-10-CM | POA: Diagnosis not present

## 2022-09-11 MED ORDER — NAPROXEN 500 MG PO TABS: 500.0000 mg | ORAL_TABLET | Freq: Two times a day (BID) | ORAL | 0 refills | Status: DC

## 2022-09-11 MED ORDER — AMOXICILLIN-POT CLAVULANATE 875-125 MG PO TABS: 1.0000 | ORAL_TABLET | Freq: Two times a day (BID) | ORAL | 0 refills | Status: DC

## 2022-09-11 NOTE — Progress Notes (Signed)
E-Visit for Dental Pain  We are sorry that you are not feeling well.  Here is how we plan to help!  Based on what you have shared with me in the questionnaire, it sounds like you have a dental infection.   Augmentin 875-125mg  twice a day for 7 days and Naprosyn 500mg  2 times a day for 7 days for discomfort  It is imperative that you see a dentist within 10 days of this eVisit to determine the cause of the dental pain and be sure it is adequately treated  A toothache or tooth pain is caused when the nerve in the root of a tooth or surrounding a tooth is irritated. Dental (tooth) infection, decay, injury, or loss of a tooth are the most common causes of dental pain. Pain may also occur after an extraction (tooth is pulled out). Pain sometimes originates from other areas and radiates to the jaw, thus appearing to be tooth pain.Bacteria growing inside your mouth can contribute to gum disease and dental decay, both of which can cause pain. A toothache occurs from inflammation of the central portion of the tooth called pulp. The pulp contains nerve endings that are very sensitive to pain. Inflammation to the pulp or pulpitis may be caused by dental cavities, trauma, and infection.    HOME CARE:   For toothaches: Over-the-counter pain medications such as acetaminophen or ibuprofen may be used. Take these as directed on the package while you arrange for a dental appointment. Avoid very cold or hot foods, because they may make the pain worse. You may get relief from biting on a cotton ball soaked in oil of cloves. You can get oil of cloves at most drug stores.  For jaw pain:  Aspirin may be helpful for problems in the joint of the jaw in adults. If pain happens every time you open your mouth widely, the temporomandibular joint (TMJ) may be the source of the pain. Yawning or taking a large bite of food may worsen the pain. An appointment with your doctor or dentist will help you find the cause.      GET HELP RIGHT AWAY IF:  You have a high fever or chills If you have had a recent head or face injury and develop headache, light headedness, nausea, vomiting, or other symptoms that concern you after an injury to your face or mouth, you could have a more serious injury in addition to your dental injury. A facial rash associated with a toothache: This condition may improve with medication. Contact your doctor for them to decide what is appropriate. Any jaw pain occurring with chest pain: Although jaw pain is most commonly caused by dental disease, it is sometimes referred pain from other areas. People with heart disease, especially people who have had stents placed, people with diabetes, or those who have had heart surgery may have jaw pain as a symptom of heart attack or angina. If your jaw or tooth pain is associated with lightheadedness, sweating, or shortness of breath, you should see a doctor as soon as possible. Trouble swallowing or excessive pain or bleeding from gums: If you have a history of a weakened immune system, diabetes, or steroid use, you may be more susceptible to infections. Infections can often be more severe and extensive or caused by unusual organisms. Dental and gum infections in people with these conditions may require more aggressive treatment. An abscess may need draining or IV antibiotics, for example.  MAKE SURE YOU   Understand these  instructions. Will watch your condition. Will get help right away if you are not doing well or get worse.  Thank you for choosing an e-visit.  Your e-visit answers were reviewed by a board certified advanced clinical practitioner to complete your personal care plan. Depending upon the condition, your plan could have included both over the counter or prescription medications.  Please review your pharmacy choice. Make sure the pharmacy is open so you can pick up prescription now. If there is a problem, you may contact your provider  through [employer] and have the prescription routed to another pharmacy.  Your safety is important to [place]. If you have drug allergies check your prescription carefully.   For the next 24 hours you can use MyChart to ask questions about today's visit, request a non-urgent call back, or ask for a work or school excuse. You will get an email in the next two days asking about your experience. I hope that your e-visit has been valuable and will speed your recovery.  I have spent 5 minutes in review of e-visit questionnaire, review and updating patient chart, medical decision making and response to patient.   Margaretann Loveless, PA-C

## 2022-09-19 ENCOUNTER — Telehealth: Payer: Medicaid Other | Admitting: Family Medicine

## 2022-09-19 DIAGNOSIS — B3731 Acute candidiasis of vulva and vagina: Secondary | ICD-10-CM | POA: Diagnosis not present

## 2022-09-19 MED ORDER — FLUCONAZOLE 150 MG PO TABS: 150.0000 mg | ORAL_TABLET | Freq: Once | ORAL | 0 refills | Status: AC

## 2022-09-19 NOTE — Progress Notes (Signed)

## 2022-10-28 ENCOUNTER — Encounter: Payer: Self-pay | Admitting: Nurse Practitioner

## 2022-10-28 ENCOUNTER — Telehealth: Payer: Medicaid Other | Admitting: Nurse Practitioner

## 2022-10-28 DIAGNOSIS — J019 Acute sinusitis, unspecified: Secondary | ICD-10-CM

## 2022-10-28 DIAGNOSIS — B9789 Other viral agents as the cause of diseases classified elsewhere: Secondary | ICD-10-CM | POA: Diagnosis not present

## 2022-10-28 MED ORDER — PREDNISONE 20 MG PO TABS: 20.0000 mg | ORAL_TABLET | Freq: Every day | ORAL | 0 refills | Status: AC

## 2022-10-28 MED ORDER — FLUTICASONE PROPIONATE 50 MCG/ACT NA SUSP: 2.0000 | Freq: Every day | NASAL | 0 refills | Status: AC

## 2022-10-28 NOTE — Progress Notes (Signed)
I have spent 5 minutes in review of e-visit questionnaire, review and updating patient chart, medical decision making and response to patient.  ° °Jerrell Mangel W Secilia Apps, NP ° °  °

## 2022-10-28 NOTE — Progress Notes (Signed)
E-Visit for Sinus Problems  We are sorry that you are not feeling well.  Here is how we plan to help!  Providers prescribe antibiotics to treat infections caused by bacteria. Antibiotics are very powerful in treating bacterial infections when they are used properly. To maintain their effectiveness, they should be used only when necessary. Overuse of antibiotics has resulted in the development of superbugs that are resistant to treatment!    Please feel free to reach out to [place] in 4-5 days if you are still not feeling better.   After careful review of your answers, I would not recommend an antibiotic for your condition.  Antibiotics are not effective against viruses and therefore should not be used to treat them. Common examples of infections caused by viruses include colds and flu   Based on what you have shared with me it looks like you have sinusitis.  Sinusitis is inflammation and infection in the sinus cavities of the head.  Based on your presentation I believe you most likely have Acute Viral Sinusitis.This is an infection most likely caused by a virus. There is not specific treatment for viral sinusitis other than to help you with the symptoms until the infection runs its course.  You may use an oral decongestant such as Mucinex D or if you have glaucoma or high blood pressure use plain Mucinex.   Saline nasal spray help and can safely be used as often as needed for congestion, I have prescribed: Fluticasone nasal spray two sprays in each nostril once a day and prednisone to help with your chest congestion  Some authorities believe that zinc sprays or the use of Echinacea may shorten the course of your symptoms.  Sinus infections are not as easily transmitted as other respiratory infection, however we still recommend that you avoid close contact with loved ones, especially the very young and elderly.  Remember to wash your hands thoroughly throughout the day as this is the number one way to  prevent the spread of infection!  Home Care: Only take medications as instructed by your medical team. Do not take these medications with alcohol. A steam or ultrasonic humidifier can help congestion.  You can place a towel over your head and breathe in the steam from hot water coming from a faucet. Avoid close contacts especially the very young and the elderly. Cover your mouth when you cough or sneeze. Always remember to wash your hands.  Get Help Right Away If: You develop worsening fever or sinus pain. You develop a severe head ache or visual changes. Your symptoms persist after you have completed your treatment plan.  Make sure you Understand these instructions. Will watch your condition. Will get help right away if you are not doing well or get worse.   Thank you for choosing an e-visit.  Your e-visit answers were reviewed by a board certified advanced clinical practitioner to complete your personal care plan. Depending upon the condition, your plan could have included both over the counter or prescription medications.  Please review your pharmacy choice. Make sure the pharmacy is open so you can pick up prescription now. If there is a problem, you may contact your provider through [employer] and have the prescription routed to another pharmacy.  Your safety is important to [place]. If you have drug allergies check your prescription carefully.   For the next 24 hours you can use MyChart to ask questions about today's visit, request a non-urgent call back, or ask for a work  or school excuse. You will get an email in the next two days asking about your experience. I hope that your e-visit has been valuable and will speed your recovery.

## 2022-11-02 ENCOUNTER — Telehealth: Payer: Medicaid Other | Admitting: Physician Assistant

## 2022-11-02 DIAGNOSIS — K047 Periapical abscess without sinus: Secondary | ICD-10-CM

## 2022-11-02 MED ORDER — CLINDAMYCIN HCL 300 MG PO CAPS: 300.0000 mg | ORAL_CAPSULE | Freq: Three times a day (TID) | ORAL | 0 refills | Status: AC

## 2022-11-02 NOTE — Progress Notes (Signed)

## 2022-11-24 ENCOUNTER — Telehealth: Payer: Medicaid Other | Admitting: Physician Assistant

## 2022-11-24 DIAGNOSIS — R197 Diarrhea, unspecified: Secondary | ICD-10-CM | POA: Diagnosis not present

## 2022-11-24 MED ORDER — LOPERAMIDE HCL 2 MG PO TABS: 2.0000 mg | ORAL_TABLET | Freq: Four times a day (QID) | ORAL | 0 refills | Status: AC | PRN

## 2022-11-24 NOTE — Progress Notes (Signed)
E-Visit for Diarrhea  We are sorry that you are not feeling well.  Here is how we plan to help!  Based on what you have shared with me it looks like you have Acute Infectious Diarrhea.  Most cases of acute diarrhea are due to infections with virus and bacteria and are self-limited conditions lasting less than 14 days.  For your symptoms you may take Imodium 2 mg tablets that are over the counter at your local pharmacy. Take two tablet now and then one after each loose stool up to 6 a day. I will send in a prescription for this.    Antibiotics are not needed for most people with diarrhea.   HOME CARE We recommend changing your diet to help with your symptoms for the next few days. Drink plenty of fluids that contain water salt and sugar. Sports drinks such as Gatorade may help.  You may try broths, soups, bananas, applesauce, soft breads, mashed potatoes or crackers.  You are considered infectious for as long as the diarrhea continues. Hand washing or use of alcohol based hand sanitizers is recommend. It is best to stay out of work or school until your symptoms stop.   GET HELP RIGHT AWAY If you have dark yellow colored urine or do not pass urine frequently you should drink more fluids.   If your symptoms worsen  If you feel like you are going to pass out (faint) You have a new problem  MAKE SURE YOU  Understand these instructions. Will watch your condition. Will get help right away if you are not doing well or get worse.  Thank you for choosing an e-visit.  Your e-visit answers were reviewed by a board certified advanced clinical practitioner to complete your personal care plan. Depending upon the condition, your plan could have included both over the counter or prescription medications.  Please review your pharmacy choice. Make sure the pharmacy is open so you can pick up prescription now. If there is a problem, you may contact your provider through [employer] and have the  prescription routed to another pharmacy.  Your safety is important to [place]. If you have drug allergies check your prescription carefully.   For the next 24 hours you can use MyChart to ask questions about today's visit, request a non-urgent call back, or ask for a work or school excuse. You will get an email in the next two days asking about your experience. I hope that your e-visit has been valuable and will speed your recovery.  I have spent 5 minutes in review of e-visit questionnaire, review and updating patient chart, medical decision making and response to patient.   Margaretann Loveless, PA-C

## 2022-12-20 ENCOUNTER — Telehealth: Payer: Medicaid Other | Admitting: Physician Assistant

## 2022-12-20 DIAGNOSIS — R6889 Other general symptoms and signs: Secondary | ICD-10-CM

## 2022-12-20 MED ORDER — OSELTAMIVIR PHOSPHATE 75 MG PO CAPS
75.0000 mg | ORAL_CAPSULE | Freq: Two times a day (BID) | ORAL | 0 refills | Status: AC
Start: 2022-12-20 — End: ?

## 2022-12-20 NOTE — Progress Notes (Signed)
I have spent 5 minutes in review of e-visit questionnaire, review and updating patient chart, medical decision making and response to patient.   Mia Milan Cody Jacklynn Dehaas, PA-C    

## 2022-12-20 NOTE — Progress Notes (Signed)
Message sent to patient requesting further input regarding current symptoms. Awaiting patient response.  

## 2022-12-20 NOTE — Progress Notes (Signed)
E visit for Flu like symptoms   We are sorry that you are not feeling well.  Here is how we plan to help! Based on what you have shared with me it looks like you may have a respiratory virus that may be influenza.  Influenza or "the flu" is   an infection caused by a respiratory virus. The flu virus is highly contagious and persons who did not receive their yearly flu vaccination may "catch" the flu from close contact.  We have anti-viral medications to treat the viruses that cause this infection. They are not a "cure" and only shorten the course of the infection. These prescriptions are most effective when they are given within the first 2 days of "flu" symptoms. Antiviral medication are indicated if you have a high risk of complications from the flu. You should  also consider an antiviral medication if you are in close contact with someone who is at risk. These medications can help patients avoid complications from the flu  but have side effects that you should know. Possible side effects from Tamiflu or oseltamivir include nausea, vomiting, diarrhea, dizziness, headaches, eye redness, sleep problems or other respiratory symptoms. You should not take Tamiflu if you have an allergy to oseltamivir or any to the ingredients in Tamiflu.  Based upon your symptoms and potential risk factors I have prescribed Oseltamivir (Tamiflu).  It has been sent to your designated pharmacy.  You will take one 75 mg capsule orally twice a day for the next 5 days. and I recommend that you follow the flu symptoms recommendation that I have listed below.  I do still recommend COVID testing as a precaution. Please let us know if this comes back positive.  Please keep well-hydrated and try to get plenty of rest. If you have a humidifier, place it in the bedroom and run it at night. Start a saline nasal rinse for nasal congestion. You can consider use of a nasal steroid spray like Flonase or Nasacort OTC. You can alternate  between Tylenol and Ibuprofen if needed for fever, body aches, headache and/or throat pain. Salt water-gargles and chloraseptic spray can be very beneficial for sore throat. Mucinex-DM for congestion or cough. Please take all prescribed medications as directed.  Remain out of work until [employer] for 24 hours without a fever-reducing medication, and you are feeling better.  You should mask until symptoms are resolved.  If anything worsens despite treatment, you need to be evaluated in-person. Please do not delay care.  ANYONE WHO HAS FLU SYMPTOMS SHOULD: Stay home. The flu is highly contagious and going out or to work exposes others! Be sure to drink plenty of fluids. Water is fine as well as fruit juices, sodas and electrolyte beverages. You may want to stay away from caffeine or alcohol. If you are nauseated, try taking small sips of liquids. How do you know if you are getting enough fluid? Your urine should be a pale yellow or almost colorless. Get rest. Taking a steamy shower or using a humidifier may help nasal congestion and ease sore throat pain. Using a saline nasal spray works much the same way. Cough drops, hard candies and sore throat lozenges may ease your cough. Line up a caregiver. Have someone check on you regularly.   GET HELP RIGHT AWAY IF: You cannot keep down liquids or your medications. You become short of breath Your fell like you are going to pass out or loose consciousness. Your symptoms persist after you have completed  your treatment plan MAKE SURE YOU  Understand these instructions. Will watch your condition. Will get help right away if you are not doing well or get worse.  Your e-visit answers were reviewed by a board certified advanced clinical practitioner to complete your personal care plan.  Depending on the condition, your plan could have included both over the counter or prescription medications.  If there is a problem please reply  once you have received  a response from your provider.  Your safety is important to [place].  If you have drug allergies check your prescription carefully.    You can use MyChart to ask questions about today's visit, request a non-urgent call back, or ask for a work or school excuse for 24 hours related to this e-Visit. If it has been greater than 24 hours you will need to follow up with your provider, or enter a new e-Visit to address those concerns.  You will get an e-mail in the next two days asking about your experience.  I hope that your e-visit has been valuable and will speed your recovery. Thank you for using e-visits.

## 2024-03-31 ENCOUNTER — Ambulatory Visit: Admitting: Nurse Practitioner
